# Patient Record
Sex: Female | Born: 1970 | Race: White | Hispanic: No | Marital: Married | Smoking: Current every day smoker
Health system: Southern US, Community
[De-identification: ages and names within clinical notes are randomized; demographics above are authoritative.]

## PROBLEM LIST (undated history)

## (undated) DIAGNOSIS — I10 Essential (primary) hypertension: Secondary | ICD-10-CM

## (undated) HISTORY — DX: Essential (primary) hypertension: I10

## (undated) HISTORY — PX: LEEP: SHX91

---

## 1997-09-03 ENCOUNTER — Encounter: Admission: RE | Admit: 1997-09-03 | Discharge: 1997-09-03 | Payer: Self-pay | Admitting: Family Medicine

## 1997-09-12 ENCOUNTER — Other Ambulatory Visit: Admission: RE | Admit: 1997-09-12 | Discharge: 1997-09-12 | Payer: Self-pay | Admitting: *Deleted

## 1997-09-12 ENCOUNTER — Encounter: Admission: RE | Admit: 1997-09-12 | Discharge: 1997-09-12 | Payer: Self-pay | Admitting: Family Medicine

## 1997-10-14 ENCOUNTER — Encounter: Admission: RE | Admit: 1997-10-14 | Discharge: 1997-10-14 | Payer: Self-pay | Admitting: Sports Medicine

## 1997-11-11 ENCOUNTER — Encounter: Admission: RE | Admit: 1997-11-11 | Discharge: 1997-11-11 | Payer: Self-pay | Admitting: Sports Medicine

## 1997-12-04 ENCOUNTER — Ambulatory Visit (HOSPITAL_COMMUNITY): Admission: RE | Admit: 1997-12-04 | Discharge: 1997-12-04 | Payer: Self-pay | Admitting: Sports Medicine

## 1997-12-15 ENCOUNTER — Encounter: Admission: RE | Admit: 1997-12-15 | Discharge: 1997-12-15 | Payer: Self-pay | Admitting: Family Medicine

## 1997-12-16 ENCOUNTER — Encounter: Admission: RE | Admit: 1997-12-16 | Discharge: 1997-12-16 | Payer: Self-pay | Admitting: Sports Medicine

## 1998-01-05 ENCOUNTER — Encounter: Admission: RE | Admit: 1998-01-05 | Discharge: 1998-01-05 | Payer: Self-pay | Admitting: Sports Medicine

## 1998-02-05 ENCOUNTER — Encounter: Admission: RE | Admit: 1998-02-05 | Discharge: 1998-02-05 | Payer: Self-pay | Admitting: Family Medicine

## 1998-02-19 ENCOUNTER — Encounter: Admission: RE | Admit: 1998-02-19 | Discharge: 1998-02-19 | Payer: Self-pay | Admitting: Family Medicine

## 1998-03-05 ENCOUNTER — Encounter: Admission: RE | Admit: 1998-03-05 | Discharge: 1998-03-05 | Payer: Self-pay | Admitting: Family Medicine

## 1998-03-10 ENCOUNTER — Ambulatory Visit (HOSPITAL_COMMUNITY): Admission: RE | Admit: 1998-03-10 | Discharge: 1998-03-10 | Payer: Self-pay | Admitting: *Deleted

## 1998-03-31 ENCOUNTER — Encounter: Admission: RE | Admit: 1998-03-31 | Discharge: 1998-03-31 | Payer: Self-pay | Admitting: Sports Medicine

## 1998-04-01 ENCOUNTER — Ambulatory Visit (HOSPITAL_COMMUNITY): Admission: RE | Admit: 1998-04-01 | Discharge: 1998-04-01 | Payer: Self-pay | Admitting: Obstetrics

## 1998-04-08 ENCOUNTER — Encounter: Admission: RE | Admit: 1998-04-08 | Discharge: 1998-04-08 | Payer: Self-pay | Admitting: Family Medicine

## 1998-04-15 ENCOUNTER — Encounter: Admission: RE | Admit: 1998-04-15 | Discharge: 1998-04-15 | Payer: Self-pay | Admitting: Family Medicine

## 1998-04-16 ENCOUNTER — Inpatient Hospital Stay (HOSPITAL_COMMUNITY): Admission: AD | Admit: 1998-04-16 | Discharge: 1998-04-19 | Payer: Self-pay | Admitting: Obstetrics & Gynecology

## 1998-06-12 ENCOUNTER — Encounter: Admission: RE | Admit: 1998-06-12 | Discharge: 1998-06-12 | Payer: Self-pay | Admitting: Family Medicine

## 1998-06-30 ENCOUNTER — Encounter: Admission: RE | Admit: 1998-06-30 | Discharge: 1998-06-30 | Payer: Self-pay | Admitting: Family Medicine

## 1999-08-17 ENCOUNTER — Encounter: Payer: Self-pay | Admitting: *Deleted

## 1999-08-17 ENCOUNTER — Inpatient Hospital Stay (HOSPITAL_COMMUNITY): Admission: EM | Admit: 1999-08-17 | Discharge: 1999-08-19 | Payer: Self-pay | Admitting: *Deleted

## 1999-09-29 ENCOUNTER — Encounter: Admission: RE | Admit: 1999-09-29 | Discharge: 1999-09-29 | Payer: Self-pay | Admitting: Family Medicine

## 1999-11-16 ENCOUNTER — Other Ambulatory Visit: Admission: RE | Admit: 1999-11-16 | Discharge: 1999-11-16 | Payer: Self-pay | Admitting: Family Medicine

## 2000-01-04 ENCOUNTER — Encounter: Admission: RE | Admit: 2000-01-04 | Discharge: 2000-01-04 | Payer: Self-pay | Admitting: Obstetrics & Gynecology

## 2000-01-28 ENCOUNTER — Encounter: Admission: RE | Admit: 2000-01-28 | Discharge: 2000-01-28 | Payer: Self-pay | Admitting: Obstetrics & Gynecology

## 2000-01-28 ENCOUNTER — Other Ambulatory Visit: Admission: RE | Admit: 2000-01-28 | Discharge: 2000-01-28 | Payer: Self-pay | Admitting: Obstetrics & Gynecology

## 2000-03-21 ENCOUNTER — Encounter: Admission: RE | Admit: 2000-03-21 | Discharge: 2000-03-21 | Payer: Self-pay | Admitting: Obstetrics & Gynecology

## 2011-04-25 ENCOUNTER — Ambulatory Visit: Payer: Self-pay | Admitting: Internal Medicine

## 2011-04-25 ENCOUNTER — Ambulatory Visit: Payer: Self-pay

## 2011-04-25 DIAGNOSIS — R079 Chest pain, unspecified: Secondary | ICD-10-CM

## 2011-04-25 DIAGNOSIS — R030 Elevated blood-pressure reading, without diagnosis of hypertension: Secondary | ICD-10-CM

## 2011-04-25 MED ORDER — MELOXICAM 15 MG PO TABS: 15.0000 mg | ORAL_TABLET | Freq: Every day | ORAL | Status: AC

## 2011-04-25 MED ORDER — HYDROCHLOROTHIAZIDE 12.5 MG PO TABS: 12.5000 mg | ORAL_TABLET | Freq: Every day | ORAL | Status: DC

## 2011-04-25 NOTE — Progress Notes (Signed)
  Subjective:    Patient ID: Carolyn Savage, female    DOB: [birth year], 41 y.o.   MRN: [id number]  HPIEpisodes of sudden onset at rest of a sharp stabbing anterior chest pain, so severe that it takes her breath, lasting for 15 minutes and resolving with a dull pain substernally that lasts for several hours or days., There is no associated shortness of breath , diaphoresis, palpitations or dizziness. She has never had heart problems in the past. Last night her pain was so severe that EMS was called and she was told that her blood pressure was 140/85 with a normal EKG except for right axis deviation. She has had a history of similar pain on and off since the age of 8 when she was told it was growing pains. 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/ this is presented no problems since then Pulmonary-no shortness of breath or dyspnea on exertion no chronic cough GI-no nausea, heartburn, dyspepsia, history of reflux, change in appetite  Social history: married with 5 kids between 36 and 28 Works at a [employer]  Family history: Both parents with hypertension      Objective:   Physical ExamIn no acute distress with stable vital signs except blood pressure 143/83 and repeated in room at 145/85 HEENT is clear No thyromegaly or lymphadenopathy The heart is regular without murmurs rubs or gallops/no clicks Lungs are clear The chest wall is tender to palpation at the right fourth costo sternal junction and it somewhat mimics her pain     [facility] reading (PRIMARY) by  Dr.Orlene Salmons:NAD      Assessment & Plan:  Problem #1 chest wall pain versus pain from undiagnosed GI  cause Trial of Mobic 15 daily for one month  Problem #2 elevated blood pressure with probable diagnosis of hypertension She wants to start treatment/hydrochlorothiazide 12.5 mg #90 Home blood pressures to be followed Recheck here in one to 3 months with those results  Problem #3 smoker Discussed cessation

## 2011-04-26 ENCOUNTER — Encounter: Payer: Self-pay | Admitting: Internal Medicine

## 2013-04-24 ENCOUNTER — Ambulatory Visit: Payer: Managed Care, Other (non HMO) | Admitting: Family Medicine

## 2013-04-24 VITALS — BP 130/80 | HR 72 | Temp 98.3°F | Ht 63.75 in | Wt 133.1 lb

## 2013-04-24 DIAGNOSIS — N939 Abnormal uterine and vaginal bleeding, unspecified: Secondary | ICD-10-CM

## 2013-04-24 DIAGNOSIS — N926 Irregular menstruation, unspecified: Secondary | ICD-10-CM

## 2013-04-24 LAB — POCT URINE PREGNANCY: Preg Test, Ur: NEGATIVE

## 2013-04-24 NOTE — Patient Instructions (Signed)
Thank you for coming in today  Your pregnancy is negative We will check some labs including thyroid and hormones If these are normal, but you have no periods for the next 2 months I would consider a pelvic ultrasound  Followup for persistent symptoms  Secondary Amenorrhea  Secondary amenorrhea is the stopping of menstrual flow for 3 6 months in a female who has previously had periods. There are many possible causes. Most of these causes are not serious. Usually, treating the underlying problem causing the loss of menses will return your periods to normal. CAUSES  Some common and uncommon causes of not menstruating include:  Malnutrition.  Low blood sugar (hypoglycemia).  Polycystic ovary disease.  Stress or fear.  Breastfeeding.  Hormone imbalance.  Ovarian failure.  Medicines.  Extreme obesity.  Cystic fibrosis.  Low body weight or drastic weight reduction from any cause.  Early menopause.  Removal of ovaries or uterus.  Contraceptives.  Illness.  Long-term (chronic) illnesses.  Cushing syndrome.  Thyroid problems.  Birth control pills, patches, or vaginal rings for birth control. RISK FACTORS You may be at greater risk of secondary amenorrhea if:  You have a family history of this condition.  You have an eating disorder.  You do athletic training. DIAGNOSIS  A diagnosis is made by your health care provider taking a medical history and doing a physical exam. This will include a pelvic exam to check for problems with your reproductive organs. Pregnancy must be ruled out. Often, numerous blood tests are done to measure different hormones in the body. Urine testing may be done. Specialized exams (ultrasound, CT scan, MRI, or hysteroscopy) may have to be done as well as measuring the body mass index (BMI). TREATMENT  Treatment depends on the cause of the amenorrhea. If an eating disorder is present, this can be treated with an adequate diet and therapy.  Chronic illnesses may improve with treatment of the illness. Amenorrhea may be corrected with medicines, lifestyle changes, or surgery. If the amenorrhea cannot be corrected, it is sometimes possible to create a false menstruation with medicines. HOME CARE INSTRUCTIONS  Maintain a healthy diet.  Manage weight problems.  Exercise regularly but not excessively.  Get adequate sleep.  Manage stress.  Be aware of changes in your menstrual cycle. Keep a record of when your periods occur. Note the date your period starts, how long it lasts, and any problems. SEEK MEDICAL CARE IF: Your symptoms do not get better with treatment. Document Released: 02/14/2006 Document Revised: 09/05/2012 Document Reviewed: 06/21/2012 [facility] Patient Information 2014 [place], [place].

## 2013-04-24 NOTE — Progress Notes (Signed)
   Subjective:    Patient ID: Carolyn Savage, female    DOB: [birth year], 43 y.o.   MRN: [id number]  HPI Patient presents with abnormal menses. Has not had menstrual cycle in past 2 months. Has taken 2 home pregnancy tests that were negative. Is having symptoms like she is getting ready to get period but she then does not have a period. Has gotten some cramping like menstrual cramps but no bleeding. Will also get the same headache that she usually had right before her headache and that came and went without menses. Has occasional unprotected sex. Last intercourse 2 weeks ago. No history of irregular menses. No hot flashes, vaginal dryness but has had some irritability for the last few months. No medications or supplements.   Review of Systems Has noticed that hair and skin more brittle and dry. No change in bowel habits. No palpitations. No change in cold or heat sensitivity. Daughter has thyroid problems and has to take synthroid. No weight loss or gain.    Objective:   Physical Exam  Constitutional: She is oriented to person, place, and time. She appears well-developed and well-nourished.  HENT:  Head: Normocephalic and atraumatic.  Mouth/Throat: Oropharynx is clear and moist. No oropharyngeal exudate.  Eyes: Conjunctivae are normal. Pupils are equal, round, and reactive to light. No scleral icterus.  Neck: Normal range of motion. Neck supple. No thyromegaly present.  Cardiovascular: Normal rate, regular rhythm, normal heart sounds and intact distal pulses.   Pulmonary/Chest: Effort normal and breath sounds normal.  Abdominal: Soft. She exhibits no distension and no mass. There is no tenderness. There is no rebound and no guarding.  Musculoskeletal: Normal range of motion.  Lymphadenopathy:    She has no cervical adenopathy.  Neurological: She is alert and oriented to person, place, and time.  Skin: Skin is warm and dry.  Psychiatric: She has a normal mood and affect. Her behavior is  normal.  No hirsutism No appreciable change in hair or skin No acne    Assessment & Plan:  #1. Irregular menses - Only 2 months so does not meet criteria yet for secondary amenorrhea - Will obtain UPT - negative. - FSH/LH, TSH, PRL pending - If symptoms persist and labs normal, would consider transvaginal ultrasound

## 2013-04-25 LAB — PROLACTIN: Prolactin: 6.8 ng/mL

## 2013-04-25 LAB — TSH: TSH: 0.721 u[IU]/mL (ref 0.350–4.500)

## 2013-04-25 LAB — FSH/LH
FSH: 8.2 m[IU]/mL
LH: 8.4 m[IU]/mL

## 2013-05-09 ENCOUNTER — Telehealth: Payer: Self-pay

## 2013-05-09 NOTE — Telephone Encounter (Signed)
Open in error

## 2013-05-09 NOTE — Telephone Encounter (Signed)
Opened in error

## 2013-05-09 NOTE — Telephone Encounter (Signed)
Message copied by Fidel LevyOE, RENEE R on Thu May 09, 2013  3:40 PM ------      Message from: Daine GipVOSS, ERIN L      Created: Tue May 07, 2013  2:10 PM       Please notify patient that labs are normal. Plan as we discussed at her appointment.            Thanks            Erin ------

## 2013-06-14 ENCOUNTER — Ambulatory Visit: Payer: Managed Care, Other (non HMO)

## 2013-06-14 ENCOUNTER — Ambulatory Visit: Payer: Managed Care, Other (non HMO) | Admitting: Emergency Medicine

## 2013-06-14 VITALS — BP 118/80 | HR 85 | Temp 98.6°F | Resp 16

## 2013-06-14 DIAGNOSIS — S93409A Sprain of unspecified ligament of unspecified ankle, initial encounter: Secondary | ICD-10-CM

## 2013-06-14 DIAGNOSIS — M25579 Pain in unspecified ankle and joints of unspecified foot: Secondary | ICD-10-CM

## 2013-06-14 MED ORDER — ACETAMINOPHEN-CODEINE #3 300-30 MG PO TABS: 1.0000 | ORAL_TABLET | ORAL | Status: DC | PRN

## 2013-06-14 NOTE — Progress Notes (Signed)
[facility] [address] [place] [address] [phone]  Date:  06/14/2013   Name:  Carolyn Savage   DOB:  Jul 24, 1970   MRN:  [id number]  PCP:  No primary provider on file.    Chief Complaint: Ankle Injury   History of Present Illness:  Carolyn Savage is a 43 y.o. very pleasant female patient who presents with the following:  Injured her right ankle while on stairs.  Says she had an inversion injury and felt and heard a "pop".  Has pain and unable to bear weight. Moderately swollen.  No improvement with over the counter medications or other home remedies. Denies other complaint or health concern today.   There are no active problems to display for this patient.   History reviewed. No pertinent past medical history.  History reviewed. No pertinent past surgical history.  History  Substance Use Topics  . Smoking status: Current Every Day Smoker    Types: Cigarettes  . Smokeless tobacco: Not on file  . Alcohol Use: Not on file    Family History  Problem Relation Age of Onset  . Hyperlipidemia Mother   . Hypertension Mother   . Cancer Father   . Diabetes Father   . Cancer Maternal Grandmother   . Diabetes Maternal Grandmother   . Stroke Maternal Grandfather     No Known Allergies  Medication list has been reviewed and updated.  No current outpatient prescriptions on file prior to visit.   No current facility-administered medications on file prior to visit.    Review of Systems:  As per HPI, otherwise negative.    Physical Examination: Filed Vitals:   06/14/13 1743  BP: 118/80  Pulse: 85  Temp: 98.6 F (37 C)  Resp: 16   There were no vitals filed for this visit. There is no weight on file to calculate BMI. Ideal Body Weight:     GEN: WDWN, NAD, Non-toxic, Alert & Oriented x 3 HEENT: Atraumatic, Normocephalic.  Ears and Nose: No external deformity. EXTR: No clubbing/cyanosis/edema NEURO: wheelchair bound  PSYCH:  Normally interactive. Conversant. Not depressed or anxious appearing.  Calm demeanor.   RIGHT ankle:  Guards, swollen and tender lateral ankle.  No deformity  Assessment and Plan: Sprain ankle Crutches  Boot Follow up in one week Tylenol #3 RICE  Signed,  Phillips Odor, MD   [facility] reading (PRIMARY) by  Dr. Dareen Piano. Negative .

## 2013-06-14 NOTE — Patient Instructions (Signed)

## 2013-07-12 ENCOUNTER — Ambulatory Visit (INDEPENDENT_AMBULATORY_CARE_PROVIDER_SITE_OTHER): Payer: Managed Care, Other (non HMO) | Admitting: Emergency Medicine

## 2013-07-12 ENCOUNTER — Ambulatory Visit (INDEPENDENT_AMBULATORY_CARE_PROVIDER_SITE_OTHER): Payer: Managed Care, Other (non HMO)

## 2013-07-12 VITALS — BP 104/68 | HR 89 | Temp 98.2°F | Resp 16 | Ht 65.0 in | Wt 139.0 lb

## 2013-07-12 DIAGNOSIS — S93409A Sprain of unspecified ligament of unspecified ankle, initial encounter: Secondary | ICD-10-CM

## 2013-07-12 DIAGNOSIS — Z5189 Encounter for other specified aftercare: Secondary | ICD-10-CM

## 2013-07-12 DIAGNOSIS — S93401D Sprain of unspecified ligament of right ankle, subsequent encounter: Secondary | ICD-10-CM

## 2013-07-12 DIAGNOSIS — M25571 Pain in right ankle and joints of right foot: Secondary | ICD-10-CM

## 2013-07-12 DIAGNOSIS — M25579 Pain in unspecified ankle and joints of unspecified foot: Secondary | ICD-10-CM

## 2013-07-12 NOTE — Patient Instructions (Signed)
Wear the lace up brace for the next week (can take off while sleeping).  In the next 2 weeks, i want you to wean yourself off the brace.    Do the exercises faithfully for the next two weeks.  Don't be afraid to put weight on the ankle  If you are worsening or not improving in the next 2 weeks, orthopedics will be our next step

## 2013-07-12 NOTE — Progress Notes (Signed)
Subjective:    Patient ID: Carolyn Savage, female    DOB: 1970/06/28, 43 y.o.   MRN: [id number]  HPI   Carolyn Savage is a very pleasant 43 yr old female here for follow up on a RIGHT ankle sprain.  She was initially seen here 1 month ago.  She was instructed to follow up in 1 week but did not do so due to concern for the cost.  She has been wearing and tall CAM walker and ambulating with crutches since that time.  She reports she has had only slight improvement.  She continues to be in pain.  She describes burning pain near both malleoli.  She has pain with weight bearing.  She feels that the ankle is swollen and states she can't fit into her shoes.  She has not taken anything for pain.  She denies numbness or tingling in the foot.  No pain in knee or hip.  She reports she did fracture her RIGHT ankle in 7th grade but has had no problems since that time.   Review of Systems  Constitutional: Negative for fever and chills.  Respiratory: Negative.   Cardiovascular: Negative.   Musculoskeletal: Positive for arthralgias, gait problem, joint swelling and myalgias.  Skin: Negative.   Neurological: Negative for weakness and numbness.       Objective:   Physical Exam  Vitals reviewed. Constitutional: She is oriented to person, place, and time. She appears well-developed and well-nourished. No distress.  HENT:  Head: Normocephalic and atraumatic.  Eyes: Conjunctivae are normal. No scleral icterus.  Cardiovascular: Intact distal pulses.   Pulmonary/Chest: Effort normal and breath sounds normal.  Musculoskeletal:       Right ankle: She exhibits no swelling, no ecchymosis, no deformity and normal pulse. Tenderness. Lateral malleolus tenderness found. No head of 5th metatarsal and no proximal fibula tenderness found. Achilles tendon normal.  Generalized tenderness about the RIGHT ankle most prominently at the lateral malleolus; active range is limited in all plains but suspect that this may be due  to aprehension  Neurological: She is alert and oriented to person, place, and time.  Skin: Skin is warm and dry.  Psychiatric: She has a normal mood and affect. Her behavior is normal.      [facility] reading (PRIMARY) by  Dr. Cleta Albertsaub - negative      Assessment & Plan:  Ankle sprain, right, subsequent encounter - Plan: DG Ankle Complete Right  Pain in joint, ankle and foot, right   Carolyn Savage is a pleasant 43 yr old female here for follow up on an ankle sprain.  She was initially seen here 1 month ago, did not follow up at 1 week as directed, has been wearing tall CAM and ambulating with crutches since that time.  She continues to have pain and difficulty bearing weight.  Xrays are normal again today.  Suspect that much of her problem is that she has been immobilized for too long in the CAM walker.  Changed to sweedo lace up brace today.  I want her to wean out of this over the next 2 weeks.  Printed exercises for her to do over these next 2 weeks.  If no improvement or if worsening, will need to see ortho.  Offered both ortho and PT to pt today but she declined citing cost.  She is aware that without improvement, the next step will be ortho - she does not necessarily need an OV for this, we can place a referral  over the phone.  Pt to call or RTC if worsening or not improving  Carolyn Savage MHS, PA-C [facility] 6/26/20157:13 PM

## 2015-11-14 ENCOUNTER — Ambulatory Visit (INDEPENDENT_AMBULATORY_CARE_PROVIDER_SITE_OTHER): Payer: Managed Care, Other (non HMO)

## 2015-11-14 ENCOUNTER — Ambulatory Visit (INDEPENDENT_AMBULATORY_CARE_PROVIDER_SITE_OTHER): Payer: Managed Care, Other (non HMO) | Admitting: Family Medicine

## 2015-11-14 VITALS — BP 120/76 | HR 74 | Temp 98.2°F | Resp 16 | Ht 65.0 in | Wt 139.0 lb

## 2015-11-14 DIAGNOSIS — M545 Low back pain, unspecified: Secondary | ICD-10-CM

## 2015-11-14 LAB — POCT URINE PREGNANCY: Preg Test, Ur: NEGATIVE

## 2015-11-14 MED ORDER — MELOXICAM 7.5 MG PO TABS: 7.5000 mg | ORAL_TABLET | Freq: Every day | ORAL | 3 refills | Status: DC

## 2015-11-14 NOTE — Progress Notes (Signed)
Chief Complaint  Patient presents with  . Back Pain    Lower back pain   . Arm Pain    right arm pain with lifting     HPI Pt reports that she has pain of the right arm from the elbow to the the wrist She reports that her symptoms are more noticeable at night She reports that when she has to life something that is greater than 5 pounds She reports that she gets constant low back pain that is concentrated in the very center.  She reports that this has been present for 3 weeks.  She has tried tylenol and ibuprofen without any improvement She was using icy hot and it took the edge off the pain.  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  No fevers or chills No difficulty urinating She reports that the pain is worse with twisting She gets pain with standing and has to slowly rise into a standing position With sitting the pain is tolerable  When the pain is at its worse 10/10. Currently she would rate her pain 6/10 because she is sitting.  She came in today because she googled her symptoms and it said she had bone cancer.   No past medical history on file.  Current Outpatient Prescriptions  Medication Sig Dispense Refill  . meloxicam (MOBIC) 7.5 MG tablet Take 1 tablet (7.5 mg total) by mouth daily. 30 tablet 3   No current facility-administered medications for this visit.     Allergies: No Known Allergies  No past surgical history on file.  Social History   Social History  . Marital status: Married    Spouse name: N/A  . Number of children: N/A  . Years of education: N/A   Social History Main Topics  . Smoking status: Current Every Day Smoker    Types: Cigarettes  . Smokeless tobacco: None  . Alcohol use None  . Drug use: Unknown  . Sexual activity: Not Asked   Other Topics Concern  . None   Social History Narrative  .  None    ROS See hpi  Objective: Vitals:   11/14/15 1501  BP: 120/76  Pulse: 74  Resp: 16  Temp: 98.2 F (36.8 C)  TempSrc: Oral  SpO2: 98%  Weight: 139 lb (63 kg)  Height: 5\' 5"  (1.651 m)    Physical Exam  Constitutional: She is oriented to person, place, and time. She appears well-developed and well-nourished.  HENT:  Head: Normocephalic and atraumatic.  Eyes: Conjunctivae and EOM are normal.  Neck: Normal range of motion. Neck supple.  Cardiovascular: Normal rate, regular rhythm and normal heart sounds.   No murmur heard. Pulmonary/Chest: Effort normal and breath sounds normal. No respiratory distress. She has no wheezes.  Musculoskeletal:       Cervical back: Normal. She exhibits normal range of motion and no tenderness.       Thoracic back: Normal. She exhibits normal range of motion and no tenderness.       Lumbar back: She exhibits tenderness. She exhibits no swelling, no edema and no deformity.  Neurological: She is alert and oriented to person, place, and time. She has normal reflexes.   CLINICAL DATA:  Low back pain  EXAM: LUMBAR SPINE - 2-3 VIEW  COMPARISON:  None.  FINDINGS: There is anatomic alignment. There is no  vertebral compression deformity. There is moderate narrowing of the L5-S1 disc. Minimal facet arthropathy at L4-5 and L5-S1.  IMPRESSION: No acute bony pathology.  Chronic changes.   Electronically Signed   By: Jolaine ClickArthur  Hoss M.D.   On: 11/14/2015 15:50   Assessment and Plan Aram BeechamCynthia was seen today for back pain and arm pain.  Diagnoses and all orders for this visit:  Acute midline low back pain without sciatica- mild DDD of lumbar spine, moderate narrowing of L5-S1 disc Advised to follow up with PT for core strengthening and exercises to reduce back pain Follow up if pain is unimproved  -     DG Lumbar Spine 2-3 Views -     POCT urine pregnancy -     Ambulatory referral to Physical Therapy -     meloxicam (MOBIC) 7.5 MG  tablet; Take 1 tablet (7.5 mg total) by mouth daily.  A total of 30 minutes were spent face-to-face with the patient during this encounter and over half of that time was spent on counseling and coordination of care.   Doristine BosworthZoe A Izzabella Besse MD   Jae Bruck Camelia PhenesA Dalilah Curlin

## 2015-11-14 NOTE — Patient Instructions (Signed)
     IF you received an x-ray today, you will receive an invoice from [facility]. Please contact [facility] at [phone] with questions or concerns regarding your invoice.   IF you received labwork today, you will receive an invoice from [facility]. Please contact [facility] at [phone] with questions or concerns regarding your invoice.   Our billing staff will not be able to assist you with questions regarding bills from these companies.  You will be contacted with the lab results as soon as they are available. The fastest way to get your results is to activate your My Chart account. Instructions are located on the last page of this paperwork. If you have not heard from us regarding the results in 2 weeks, please contact this office.      

## 2016-03-04 ENCOUNTER — Ambulatory Visit: Payer: Managed Care, Other (non HMO)

## 2016-03-05 ENCOUNTER — Ambulatory Visit (INDEPENDENT_AMBULATORY_CARE_PROVIDER_SITE_OTHER): Payer: Managed Care, Other (non HMO) | Admitting: Physician Assistant

## 2016-03-05 VITALS — BP 108/70 | HR 70 | Temp 98.3°F | Resp 16 | Ht 65.0 in | Wt 138.8 lb

## 2016-03-05 DIAGNOSIS — Z1329 Encounter for screening for other suspected endocrine disorder: Secondary | ICD-10-CM | POA: Diagnosis not present

## 2016-03-05 DIAGNOSIS — Z13228 Encounter for screening for other metabolic disorders: Secondary | ICD-10-CM

## 2016-03-05 DIAGNOSIS — Z1231 Encounter for screening mammogram for malignant neoplasm of breast: Secondary | ICD-10-CM

## 2016-03-05 DIAGNOSIS — Z130 Encounter for screening for diseases of the blood and blood-forming organs and certain disorders involving the immune mechanism: Secondary | ICD-10-CM | POA: Diagnosis not present

## 2016-03-05 DIAGNOSIS — E7800 Pure hypercholesterolemia, unspecified: Secondary | ICD-10-CM | POA: Diagnosis not present

## 2016-03-05 DIAGNOSIS — Z01419 Encounter for gynecological examination (general) (routine) without abnormal findings: Secondary | ICD-10-CM

## 2016-03-05 DIAGNOSIS — Z114 Encounter for screening for human immunodeficiency virus [HIV]: Secondary | ICD-10-CM

## 2016-03-05 DIAGNOSIS — Z0000 Encounter for general adult medical examination without abnormal findings: Secondary | ICD-10-CM | POA: Diagnosis not present

## 2016-03-05 NOTE — Progress Notes (Signed)
Carolyn Savage  MRN: [id number] DOB: 04-08-1970  Subjective:  Pt presents to clinic for a CPE.  Last dental exam: never  Last vision exam: long time Last pap smear: almost 17 years ago Last mammogram: never  Vaccinations      Tetanus last 10 years  Exercise: no Diet: mostly eats out, coffee, water, 1 mountain dew Sleep: no problems  There are no active problems to display for this patient.   No current outpatient prescriptions on file prior to visit.   No current facility-administered medications on file prior to visit.     No Known Allergies  Social History   Social History  . Marital status: Married    Spouse name: N/A  . Number of children: N/A  . Years of education: N/A   Social History Main Topics  . Smoking status: Current Every Day Smoker    Packs/day: 1.00    Years: 21.00    Types: Cigarettes  . Smokeless tobacco: Never Used  . Alcohol use No     Comment: social very rare - a few times a year  . Drug use: No  . Sexual activity: Yes    Partners: Male   Other Topics Concern  . None   Social History Narrative   Marital status: married   Children: 5    Lives with: husband and 2 children   Employment: Press photographer       Exercise:  Not regularly   Sleep: most part   Seatbelt: 100%   Guns in home: yes       Secured: yes          History reviewed. No pertinent surgical history.  Family History  Problem Relation Age of Onset  . Hyperlipidemia Mother   . Hypertension Mother   . Lung cancer Mother     smoker  . Diabetes Father   . Lung cancer Father     smoker  . Breast cancer Sister     age 54  . Diabetes Maternal Grandmother   . Breast cancer Maternal Grandmother   . Stroke Maternal Grandfather     Review of Systems  Constitutional: Negative.   HENT: Negative.   Eyes: Positive for visual disturbance (random).  Respiratory: Positive for cough (intermittent). Negative for shortness of breath and wheezing.   Cardiovascular:  Negative.   Gastrointestinal: Negative.   Endocrine: Negative.   Genitourinary: Positive for urgency (stress incontinence).  Musculoskeletal: Negative.   Skin: Negative.   Allergic/Immunologic: Negative.   Neurological: Positive for headaches (intermittent - self resolution or treated with tylenol o rmotrin than resolves).  Hematological: Negative.   Psychiatric/Behavioral: Negative.     Objective:  BP 108/70   Pulse 70   Temp 98.3 F (36.8 C) (Oral)   Resp 16   Ht 5' 5"  (1.651 m)   Wt 138 lb 12.8 oz (63 kg)   LMP 11/18/2014 Comment: Hasn't had period in over a year   SpO2 95%   BMI 23.10 kg/m   Physical Exam  Constitutional: She is oriented to person, place, and time and well-developed, well-nourished, and in no distress.  HENT:  Head: Normocephalic and atraumatic.  Right Ear: Hearing, tympanic membrane, external ear and ear canal normal.  Left Ear: Hearing, tympanic membrane, external ear and ear canal normal.  Nose: Nose normal.  Mouth/Throat: Uvula is midline, oropharynx is clear and moist and mucous membranes are normal.  Eyes: Conjunctivae and EOM are normal. Pupils are equal, round, and reactive to light.  Neck: Trachea normal and normal range of motion. Neck supple. No thyroid mass and no thyromegaly present.  Cardiovascular: Normal rate, regular rhythm and normal heart sounds.   No murmur heard. Pulmonary/Chest: Effort normal and breath sounds normal. She has no wheezes. Right breast exhibits no inverted nipple, no mass, no nipple discharge, no skin change and no tenderness. Left breast exhibits no inverted nipple, no mass, no nipple discharge, no skin change and no tenderness. Breasts are symmetrical.  Abdominal: Soft. Bowel sounds are normal. There is no tenderness.  Genitourinary: Vagina normal, uterus normal, cervix normal, right adnexa normal, left adnexa normal and vulva normal.  Musculoskeletal: Normal range of motion.  Lymphadenopathy:    She has no cervical  adenopathy.  Neurological: She is alert and oriented to person, place, and time. She has normal motor skills, normal sensation, normal strength and normal reflexes. Gait normal.  Skin: Skin is warm and dry.  Psychiatric: Mood, memory, affect and judgment normal.    Visual Acuity Screening   Right eye Left eye Both eyes  Without correction: 20/40-1 20/40 20/25  With correction:       Assessment and Plan :  Annual physical exam - anticipatory guidance  Screening for deficiency anemia - Plan: CBC with Differential/Platelet  Screening for metabolic disorder - Plan: CMP14+EGFR  Elevated cholesterol - Plan: Lipid panel - h/o of high in the past but she did not want to take medications at that time and she does not think that she wants to start medication for that now - we discussed fish oil and increase in omega 3 foods as well as red yeast rice - will counsel patient more once labs return  Screening for thyroid disorder - Plan: TSH  Screening mammogram, encounter for - Plan: MM Digital Screening - pt has never had before and her sister has h/o non-genetic breast cancer - she needs to start screenings  Encounter for gynecological examination without abnormal finding - Plan: Pap IG and HPV (high risk) DNA detection - check labs - pt declined STD testing  Screening for HIV (human immunodeficiency virus) - Plan: HIV antibody  Windell Hummingbird PA-C  [facility] [place] 03/05/2016 4:24 PM

## 2016-03-05 NOTE — Patient Instructions (Addendum)
Health Maintenance, Female Introduction Adopting a healthy lifestyle and getting preventive care can go a long way to promote health and wellness. Talk with your health care provider about what schedule of regular examinations is right for you. This is a good chance for you to check in with your provider about disease prevention and staying healthy. In between checkups, there are plenty of things you can do on your own. Experts have done a lot of research about which lifestyle changes and preventive measures are most likely to keep you healthy. Ask your health care provider for more information. Weight and diet Eat a healthy diet  Be sure to include plenty of vegetables, fruits, low-fat dairy products, and lean protein.  Do not eat a lot of foods high in solid fats, added sugars, or salt.  Get regular exercise. This is one of the most important things you can do for your health.  Most adults should exercise for at least 150 minutes each week. The exercise should increase your heart rate and make you sweat (moderate-intensity exercise).  Most adults should also do strengthening exercises at least twice a week. This is in addition to the moderate-intensity exercise. Maintain a healthy weight  Body mass index (BMI) is a measurement that can be used to identify possible weight problems. It estimates body fat based on height and weight. Your health care provider can help determine your BMI and help you achieve or maintain a healthy weight.  For females 4 years of age and older:  A BMI below 18.5 is considered underweight.  A BMI of 18.5 to 24.9 is normal.  A BMI of 25 to 29.9 is considered overweight.  A BMI of 30 and above is considered obese. Watch levels of cholesterol and blood lipids  You should start having your blood tested for lipids and cholesterol at 45 years of age, then have this test every 5 years.  You may need to have your cholesterol levels checked more often  if:  Your lipid or cholesterol levels are high.  You are older than 45 years of age.  You are at high risk for heart disease. Cancer screening Lung Cancer  Lung cancer screening is recommended for adults 12-31 years old who are at high risk for lung cancer because of a history of smoking.  A yearly low-dose CT scan of the lungs is recommended for people who:  Currently smoke.  Have quit within the past 15 years.  Have at least a 30-pack-year history of smoking. A pack year is smoking an average of one pack of cigarettes a day for 1 year.  Yearly screening should continue until it has been 15 years since you quit.  Yearly screening should stop if you develop a health problem that would prevent you from having lung cancer treatment. Breast Cancer  Practice breast self-awareness. This means understanding how your breasts normally appear and feel.  It also means doing regular breast self-exams. Let your health care provider know about any changes, no matter how small.  If you are in your 20s or 30s, you should have a clinical breast exam (CBE) by a health care provider every 1-3 years as part of a regular health exam.  If you are 74 or older, have a CBE every year. Also consider having a breast X-ray (mammogram) every year.  If you have a family history of breast cancer, talk to your health care provider about genetic screening.  If you are at high risk for breast cancer,  talk to your health care provider about having an MRI and a mammogram every year.  Breast cancer gene (BRCA) assessment is recommended for women who have family members with BRCA-related cancers. BRCA-related cancers include:  Breast.  Ovarian.  Tubal.  Peritoneal cancers.  Results of the assessment will determine the need for genetic counseling and BRCA1 and BRCA2 testing. Cervical Cancer  Your health care provider may recommend that you be screened regularly for cancer of the pelvic organs (ovaries,  uterus, and vagina). This screening involves a pelvic examination, including checking for microscopic changes to the surface of your cervix (Pap test). You may be encouraged to have this screening done every 3 years, beginning at age 59.  For women ages 9-65, health care providers may recommend pelvic exams and Pap testing every 3 years, or they may recommend the Pap and pelvic exam, combined with testing for human papilloma virus (HPV), every 5 years. Some types of HPV increase your risk of cervical cancer. Testing for HPV may also be done on women of any age with unclear Pap test results.  Other health care providers may not recommend any screening for nonpregnant women who are considered low risk for pelvic cancer and who do not have symptoms. Ask your health care provider if a screening pelvic exam is right for you.  If you have had past treatment for cervical cancer or a condition that could lead to cancer, you need Pap tests and screening for cancer for at least 20 years after your treatment. If Pap tests have been discontinued, your risk factors (such as having a new sexual partner) need to be reassessed to determine if screening should resume. Some women have medical problems that increase the chance of getting cervical cancer. In these cases, your health care provider may recommend more frequent screening and Pap tests. Colorectal Cancer  This type of cancer can be detected and often prevented.  Routine colorectal cancer screening usually begins at 45 years of age and continues through 45 years of age.  Your health care provider may recommend screening at an earlier age if you have risk factors for colon cancer.  Your health care provider may also recommend using home test kits to check for hidden blood in the stool.  A small camera at the end of a tube can be used to examine your colon directly (sigmoidoscopy or colonoscopy). This is done to check for the earliest forms of colorectal  cancer.  Routine screening usually begins at age 31.  Direct examination of the colon should be repeated every 5-10 years through 45 years of age. However, you may need to be screened more often if early forms of precancerous polyps or small growths are found. Skin Cancer  Check your skin from head to toe regularly.  Tell your health care provider about any new moles or changes in moles, especially if there is a change in a mole's shape or color.  Also tell your health care provider if you have a mole that is larger than the size of a pencil eraser.  Always use sunscreen. Apply sunscreen liberally and repeatedly throughout the day.  Protect yourself by wearing long sleeves, pants, a wide-brimmed hat, and sunglasses whenever you are outside. Heart disease, diabetes, and high blood pressure  High blood pressure causes heart disease and increases the risk of stroke. High blood pressure is more likely to develop in:  People who have blood pressure in the high end of the normal range (130-139/85-89 mm Hg).  People who are overweight or obese.  People who are African American.  If you are 18-39 years of age, have your blood pressure checked every 3-5 years. If you are 40 years of age or older, have your blood pressure checked every year. You should have your blood pressure measured twice-once when you are at a hospital or clinic, and once when you are not at a hospital or clinic. Record the average of the two measurements. To check your blood pressure when you are not at a hospital or clinic, you can use:  An automated blood pressure machine at a pharmacy.  A home blood pressure monitor.  If you are between 55 years and 79 years old, ask your health care provider if you should take aspirin to prevent strokes.  Have regular diabetes screenings. This involves taking a blood sample to check your fasting blood sugar level.  If you are at a normal weight and have a low risk for diabetes,  have this test once every three years after 45 years of age.  If you are overweight and have a high risk for diabetes, consider being tested at a younger age or more often. Preventing infection Hepatitis B  If you have a higher risk for hepatitis B, you should be screened for this virus. You are considered at high risk for hepatitis B if:  You were born in a country where hepatitis B is common. Ask your health care provider which countries are considered high risk.  Your parents were born in a high-risk country, and you have not been immunized against hepatitis B (hepatitis B vaccine).  You have HIV or AIDS.  You use needles to inject street drugs.  You live with someone who has hepatitis B.  You have had sex with someone who has hepatitis B.  You get hemodialysis treatment.  You take certain medicines for conditions, including cancer, organ transplantation, and autoimmune conditions. Hepatitis C  Blood testing is recommended for:  Everyone born from 1945 through 1965.  Anyone with known risk factors for hepatitis C. Sexually transmitted infections (STIs)  You should be screened for sexually transmitted infections (STIs) including gonorrhea and chlamydia if:  You are sexually active and are younger than 45 years of age.  You are older than 45 years of age and your health care provider tells you that you are at risk for this type of infection.  Your sexual activity has changed since you were last screened and you are at an increased risk for chlamydia or gonorrhea. Ask your health care provider if you are at risk.  If you do not have HIV, but are at risk, it may be recommended that you take a prescription medicine daily to prevent HIV infection. This is called pre-exposure prophylaxis (PrEP). You are considered at risk if:  You are sexually active and do not regularly use condoms or know the HIV status of your partner(s).  You take drugs by injection.  You are sexually  active with a partner who has HIV. Talk with your health care provider about whether you are at high risk of being infected with HIV. If you choose to begin PrEP, you should first be tested for HIV. You should then be tested every 3 months for as long as you are taking PrEP. Pregnancy  If you are premenopausal and you may become pregnant, ask your health care provider about preconception counseling.  If you may become pregnant, take 400 to 800 micrograms (mcg) of folic acid   every day.  If you want to prevent pregnancy, talk to your health care provider about birth control (contraception). Osteoporosis and menopause  Osteoporosis is a disease in which the bones lose minerals and strength with aging. This can result in serious bone fractures. Your risk for osteoporosis can be identified using a bone density scan.  If you are 48 years of age or older, or if you are at risk for osteoporosis and fractures, ask your health care provider if you should be screened.  Ask your health care provider whether you should take a calcium or vitamin D supplement to lower your risk for osteoporosis.  Menopause may have certain physical symptoms and risks.  Hormone replacement therapy may reduce some of these symptoms and risks. Talk to your health care provider about whether hormone replacement therapy is right for you. Follow these instructions at home:  Schedule regular health, dental, and eye exams.  Stay current with your immunizations.  Do not use any tobacco products including cigarettes, chewing tobacco, or electronic cigarettes.  If you are pregnant, do not drink alcohol.  If you are breastfeeding, limit how much and how often you drink alcohol.  Limit alcohol intake to no more than 1 drink per day for nonpregnant women. One drink equals 12 ounces of beer, 5 ounces of wine, or 1 ounces of hard liquor.  Do not use street drugs.  Do not share needles.  Ask your health care provider for  help if you need support or information about quitting drugs.  Tell your health care provider if you often feel depressed.  Tell your health care provider if you have ever been abused or do not feel safe at home. This information is not intended to replace advice given to you by your health care provider. Make sure you discuss any questions you have with your health care provider. Document Released: 07/19/2010 Document Revised: 06/11/2015 Document Reviewed: 10/07/2014  2017 Elsevier    IF you received an x-ray today, you will receive an invoice from [facility]. Please contact [facility] at [phone] with questions or concerns regarding your invoice.   IF you received labwork today, you will receive an invoice from [facility]. Please contact [facility] at [phone] with questions or concerns regarding your invoice.   Our billing staff will not be able to assist you with questions regarding bills from these companies.  You will be contacted with the lab results as soon as they are available. The fastest way to get your results is to activate your My Chart account. Instructions are located on the last page of this paperwork. If you have not heard from [place] regarding the results in 2 weeks, please contact this office.

## 2016-03-06 LAB — CBC WITH DIFFERENTIAL/PLATELET
Basophils Absolute: 0.1 10*3/uL (ref 0.0–0.2)
Basos: 1 %
EOS (ABSOLUTE): 0.3 10*3/uL (ref 0.0–0.4)
EOS: 2 %
HEMATOCRIT: 43.8 % (ref 34.0–46.6)
Hemoglobin: 15.5 g/dL (ref 11.1–15.9)
IMMATURE GRANULOCYTES: 0 %
Immature Grans (Abs): 0 10*3/uL (ref 0.0–0.1)
Lymphocytes Absolute: 3.1 10*3/uL (ref 0.7–3.1)
Lymphs: 30 %
MCH: 31.3 pg (ref 26.6–33.0)
MCHC: 35.4 g/dL (ref 31.5–35.7)
MCV: 88 fL (ref 79–97)
MONOS ABS: 0.6 10*3/uL (ref 0.1–0.9)
Monocytes: 6 %
Neutrophils Absolute: 6.2 10*3/uL (ref 1.4–7.0)
Neutrophils: 61 %
PLATELETS: 256 10*3/uL (ref 150–379)
RBC: 4.96 x10E6/uL (ref 3.77–5.28)
RDW: 13.4 % (ref 12.3–15.4)
WBC: 10.2 10*3/uL (ref 3.4–10.8)

## 2016-03-06 LAB — TSH: TSH: 0.415 u[IU]/mL — ABNORMAL LOW (ref 0.450–4.500)

## 2016-03-06 LAB — CMP14+EGFR
ALT: 13 IU/L (ref 0–32)
AST: 19 IU/L (ref 0–40)
Albumin/Globulin Ratio: 1.7 (ref 1.2–2.2)
Albumin: 4.4 g/dL (ref 3.5–5.5)
Alkaline Phosphatase: 97 IU/L (ref 39–117)
BUN/Creatinine Ratio: 12 (ref 9–23)
BUN: 11 mg/dL (ref 6–24)
Bilirubin Total: 0.3 mg/dL (ref 0.0–1.2)
CALCIUM: 9.8 mg/dL (ref 8.7–10.2)
CO2: 26 mmol/L (ref 18–29)
CREATININE: 0.92 mg/dL (ref 0.57–1.00)
Chloride: 102 mmol/L (ref 96–106)
GFR calc Af Amer: 88 mL/min/{1.73_m2} (ref >59)
GFR, EST NON AFRICAN AMERICAN: 76 mL/min/{1.73_m2} (ref >59)
GLUCOSE: 85 mg/dL (ref 65–99)
Globulin, Total: 2.6 g/dL (ref 1.5–4.5)
Potassium: 4.8 mmol/L (ref 3.5–5.2)
Sodium: 143 mmol/L (ref 134–144)
Total Protein: 7 g/dL (ref 6.0–8.5)

## 2016-03-06 LAB — LIPID PANEL
CHOL/HDL RATIO: 5.8 ratio — AB (ref 0.0–4.4)
Cholesterol, Total: 253 mg/dL — ABNORMAL HIGH (ref 100–199)
HDL: 44 mg/dL (ref >39)
LDL CALC: 175 mg/dL — AB (ref 0–99)
TRIGLYCERIDES: 169 mg/dL — AB (ref 0–149)
VLDL Cholesterol Cal: 34 mg/dL (ref 5–40)

## 2016-03-06 LAB — HIV ANTIBODY (ROUTINE TESTING W REFLEX): HIV Screen 4th Generation wRfx: NONREACTIVE

## 2016-03-08 NOTE — Addendum Note (Signed)
Addended by: Baldwin CrownJOHNSON, SHAQUETTA D on: 03/08/2016 04:37 PM   Modules accepted: Orders

## 2016-03-09 LAB — PAP IG AND HPV HIGH-RISK
HPV, high-risk: NEGATIVE
PAP SMEAR COMMENT: 0

## 2016-03-11 LAB — T4, FREE: FREE T4: 1.14 ng/dL (ref 0.82–1.77)

## 2016-03-11 LAB — SPECIMEN STATUS REPORT

## 2016-06-20 ENCOUNTER — Encounter: Payer: Self-pay | Admitting: Physician Assistant

## 2016-06-20 ENCOUNTER — Ambulatory Visit (INDEPENDENT_AMBULATORY_CARE_PROVIDER_SITE_OTHER): Payer: Managed Care, Other (non HMO) | Admitting: Physician Assistant

## 2016-06-20 VITALS — BP 144/73 | HR 64 | Temp 98.3°F | Resp 18 | Ht 65.0 in | Wt 133.0 lb

## 2016-06-20 DIAGNOSIS — N950 Postmenopausal bleeding: Secondary | ICD-10-CM

## 2016-06-20 NOTE — Patient Instructions (Signed)
     IF you received an x-ray today, you will receive an invoice from [facility]. Please contact [facility] at [phone] with questions or concerns regarding your invoice.   IF you received labwork today, you will receive an invoice from [facility]. Please contact [facility] at [phone] with questions or concerns regarding your invoice.   Our billing staff will not be able to assist you with questions regarding bills from these companies.  You will be contacted with the lab results as soon as they are available. The fastest way to get your results is to activate your My Chart account. Instructions are located on the last page of this paperwork. If you have not heard from us regarding the results in 2 weeks, please contact this office.     

## 2016-06-20 NOTE — Progress Notes (Signed)
   Carolyn SaverCynthia F Savage  MRN: [id number] DOB: Jun 04, 1970  PCP: Patient, No Pcp Per  Chief Complaint  Patient presents with  . Vaginal Bleeding    spots mostly after sex but is spotting now and hasnt been active in a couple of days and per pt has seen pink tissue discharge   . Abdominal Cramping    Subjective:  Pt presents to clinic for vaginal bleeding over the last month along with suprapubic pain - she thought she noticed it along with sex but her last sex was 3 days ago and she is still having some pinkish discharge.  Some pain during sex which is different than normal.  No change in sexual partner.  No change in roughness or activity, no problems with vaginal dryness.  She has some pain with penetration deep that is worse with position where he is able to penetrate deeper.  She is not worried about STDs.  Fells like the other day that maybe some tissue came out while on the toilet.  Last menes 10/2014 no bleeding since then.  Review of Systems  Genitourinary: Positive for vaginal discharge (bleeding).    There are no active problems to display for this patient.   No current outpatient prescriptions on file prior to visit.   No current facility-administered medications on file prior to visit.     No Known Allergies  Pt patients past, family and social history were reviewed and updated.   Objective:  BP (!) 144/73   Pulse 64   Temp 98.3 F (36.8 C) (Oral)   Resp 18   Ht 5\' 5"  (1.651 m)   Wt 133 lb (60.3 kg)   LMP 11/18/2014 Comment: Hasn't had period in over a year   SpO2 97%   BMI 22.13 kg/m   Physical Exam  Constitutional: She is oriented to person, place, and time and well-developed, well-nourished, and in no distress.  HENT:  Head: Normocephalic and atraumatic.  Right Ear: Hearing and external ear normal.  Left Ear: Hearing and external ear normal.  Eyes: Conjunctivae are normal.  Neck: Normal range of motion.  Pulmonary/Chest: Effort normal.  Genitourinary:  Uterus normal, right adnexa normal and vulva normal. Thin  red and vaginal discharge found.  Genitourinary Comments: Blood out of cervical os, no vaginal canal lesions  Neurological: She is alert and oriented to person, place, and time. Gait normal.  Skin: Skin is warm and dry.  Psychiatric: Mood, memory, affect and judgment normal.  Vitals reviewed.   Assessment and Plan :  Postmenopausal bleeding - Plan: GC/Chlamydia Probe Amp, Ambulatory referral to Gynecology check for chlamydia (low likelihood) and send for referral to gyn as she has been great than a year without menses  Benny LennertSarah Jaequan Propes PA-C  [facility] 06/20/2016 3:27 PM

## 2016-06-22 LAB — GC/CHLAMYDIA PROBE AMP
CHLAMYDIA, DNA PROBE: NEGATIVE
NEISSERIA GONORRHOEAE BY PCR: NEGATIVE

## 2016-12-05 ENCOUNTER — Ambulatory Visit: Payer: Managed Care, Other (non HMO) | Admitting: Physician Assistant

## 2016-12-05 ENCOUNTER — Other Ambulatory Visit: Payer: Self-pay

## 2016-12-05 ENCOUNTER — Encounter: Payer: Self-pay | Admitting: Physician Assistant

## 2016-12-05 VITALS — BP 126/82 | HR 82 | Temp 98.7°F | Resp 16 | Ht 65.0 in | Wt 132.0 lb

## 2016-12-05 DIAGNOSIS — M62830 Muscle spasm of back: Secondary | ICD-10-CM

## 2016-12-05 MED ORDER — CYCLOBENZAPRINE HCL 10 MG PO TABS: 5.0000 mg | ORAL_TABLET | Freq: Three times a day (TID) | ORAL | 0 refills | Status: DC | PRN

## 2016-12-05 MED ORDER — IBUPROFEN 600 MG PO TABS: 600.0000 mg | ORAL_TABLET | Freq: Three times a day (TID) | ORAL | 0 refills | Status: DC | PRN

## 2016-12-05 NOTE — Patient Instructions (Addendum)
Please take ibuprofen as prescribed.  Make sure there is food on your belly.   Do not operate heavy machinery with the flexeril.  You need to use a tennis ball against a wall on the muscle for added relief.    Muscle Cramps and Spasms Muscle cramps and spasms are when muscles tighten by themselves. They usually get better within minutes. Muscle cramps are painful. They are usually stronger and last longer than muscle spasms. Muscle spasms may or may not be painful. They can last a few seconds or much longer. Follow these instructions at home:  Drink enough fluid to keep your pee (urine) clear or pale yellow.  Massage, stretch, and relax the muscle.  If directed, apply heat to tight or tense muscles as often as told by your doctor. Use the heat source that your doctor recommends. ? Place a towel between your skin and the heat source. ? Leave the heat on for 20-30 minutes. ? Take off the heat if your skin turns bright red. This is especially important if you are unable to feel pain, heat, or cold. You may have a greater risk of getting burned.  If directed, put ice on the affected area. This may help if you are sore or have pain after a cramp or spasm. ? Put ice in a plastic bag. ? Place a towel between your skin and the bag. ? Leave the ice on for 20 minutes, 2-3 times a day.  Take over-the-counter and prescription medicines only as told by your doctor.  Pay attention to any changes in your symptoms. Contact a doctor if:  Your cramps or spasms get worse or happen more often.  Your cramps or spasms do not get better with time. This information is not intended to replace advice given to you by your health care provider. Make sure you discuss any questions you have with your health care provider. Document Released: 12/17/2007 Document Revised: 02/04/2015 Document Reviewed: 10/07/2014 Elsevier Interactive Patient Education  2018 [employer]     IF you received an x-ray today, you  will receive an invoice from [facility]. Please contact [facility] at [phone] with questions or concerns regarding your invoice.   IF you received labwork today, you will receive an invoice from [facility]. Please contact [facility] at [phone] with questions or concerns regarding your invoice.   Our billing staff will not be able to assist you with questions regarding bills from these companies.  You will be contacted with the lab results as soon as they are available. The fastest way to get your results is to activate your My Chart account. Instructions are located on the last page of this paperwork. If you have not heard from us regarding the results in 2 weeks, please contact this office.

## 2016-12-05 NOTE — Progress Notes (Signed)
[facility] [address], [place] [phone]  Date:  12/05/2016   Name:  Carolyn Savage   DOB:  07/10/1970   MRN:  [id number]  PCP:  Patient, No Pcp Per    History of Present Illness:  Carolyn Savage is a 46 y.o. female patient who presents to PCP with  Chief Complaint  Patient presents with  . Shoulder Pain    x 4 days/ pain going to back/ pt does heavy lifting at work     3 days ago, she woke up with pain in her shoulder.  She thought she may have slept on it wrong.  As the days progressed, to now, she has had more pain of her shoulder.  Right shoulder pain described as sharp along the back of her shoulder at her shoulder blade. She has never injured it before then.   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 No hx of trauma  There are no active problems to display for this patient.   No past medical history on file.  Past Surgical History:  Procedure Laterality Date  . LEEP      Social History   Tobacco Use  . Smoking status: Current Every Day Smoker    Packs/day: 1.00    Years: 21.00    Pack years: 21.00    Types: Cigarettes  . Smokeless tobacco: Never Used  Substance Use Topics  . Alcohol use: No    Comment: social very rare - a few times a year  . Drug use: No    Family History  Problem Relation Age of Onset  . Hyperlipidemia Mother   . Hypertension Mother   . Lung cancer Mother        smoker  . Diabetes Father   . Lung cancer Father        smoker  . Breast cancer Sister        age 45  . Diabetes Maternal Grandmother   . Breast cancer Maternal Grandmother   . Stroke Maternal Grandfather     No Known Allergies  Medication list has been reviewed and updated.  No current outpatient medications on file prior to visit.   No current facility-administered medications on file prior to visit.     ROS ROS otherwise  unremarkable unless listed above.  Physical Examination: BP 126/82   Pulse 82   Temp 98.7 F (37.1 C) (Oral)   Resp 16   Ht 5\' 5"  (1.651 m)   Wt 132 lb (59.9 kg)   LMP 11/18/2014 Comment: Hasn't had period in over a year   SpO2 96%   BMI 21.97 kg/m  Ideal Body Weight: Weight in (lb) to have BMI = 25: 149.9  Physical Exam  Constitutional: She is oriented to person, place, and time. She appears well-developed and well-nourished. No distress.  HENT:  Head: Normocephalic and atraumatic.  Right Ear: External ear normal.  Left Ear: External ear normal.  Eyes: Conjunctivae and EOM are normal. Pupils are equal, round, and reactive to light.  Cardiovascular: Normal rate.  Pulmonary/Chest: Effort normal. No respiratory distress.  Musculoskeletal:  Spasm appreciated at the right thoracic just inferior to the scapula.  exquisitely tender without erythema or warmth to the region.  Neurological: She is alert and oriented to person, place, and time.  Skin: She is not diaphoretic.  Psychiatric: She has a normal  mood and affect. Her behavior is normal.   Procedure performed with McVey, PA-C: Muscle spasm at the posterior thoracic was cleansed with [facility].  The tissue was lifted with gentle squeeze.  Muscle searched with 1% lidocaine placed at the musculature.  Wiped with et [place].  Bandage placed.  Assessment and Plan: Carolyn Savage is a 46 y.o. female who is here today for cc of  Chief Complaint  Patient presents with  . Shoulder Pain    x 4 days/ pain going to back/ pt does heavy lifting at work   muscle relaxer and nsaid given.   Advised rest and icing after heat at this time, and stretches.  Rtc in 1 week if no improvement.  Spasm of thoracic back muscle - Plan: cyclobenzaprine (FLEXERIL) 10 MG tablet, ibuprofen (ADVIL,MOTRIN) 600 MG tablet  Trena PlattStephanie Jachob Mcclean, PA-C [facility] 11/25/20186:34 PM

## 2017-03-07 ENCOUNTER — Ambulatory Visit (INDEPENDENT_AMBULATORY_CARE_PROVIDER_SITE_OTHER): Payer: Managed Care, Other (non HMO) | Admitting: Physician Assistant

## 2017-03-07 ENCOUNTER — Encounter: Payer: Self-pay | Admitting: Physician Assistant

## 2017-03-07 ENCOUNTER — Other Ambulatory Visit: Payer: Self-pay

## 2017-03-07 VITALS — BP 126/86 | HR 91 | Temp 98.4°F | Resp 17 | Ht 65.0 in | Wt 129.6 lb

## 2017-03-07 DIAGNOSIS — Z01419 Encounter for gynecological examination (general) (routine) without abnormal findings: Secondary | ICD-10-CM

## 2017-03-07 DIAGNOSIS — R87610 Atypical squamous cells of undetermined significance on cytologic smear of cervix (ASC-US): Secondary | ICD-10-CM | POA: Diagnosis not present

## 2017-03-07 DIAGNOSIS — N898 Other specified noninflammatory disorders of vagina: Secondary | ICD-10-CM

## 2017-03-07 DIAGNOSIS — Z0000 Encounter for general adult medical examination without abnormal findings: Secondary | ICD-10-CM | POA: Diagnosis not present

## 2017-03-07 DIAGNOSIS — Z1231 Encounter for screening mammogram for malignant neoplasm of breast: Secondary | ICD-10-CM | POA: Diagnosis not present

## 2017-03-07 DIAGNOSIS — R7989 Other specified abnormal findings of blood chemistry: Secondary | ICD-10-CM

## 2017-03-07 DIAGNOSIS — E7800 Pure hypercholesterolemia, unspecified: Secondary | ICD-10-CM | POA: Diagnosis not present

## 2017-03-07 DIAGNOSIS — Z130 Encounter for screening for diseases of the blood and blood-forming organs and certain disorders involving the immune mechanism: Secondary | ICD-10-CM

## 2017-03-07 DIAGNOSIS — Z23 Encounter for immunization: Secondary | ICD-10-CM

## 2017-03-07 DIAGNOSIS — Z13228 Encounter for screening for other metabolic disorders: Secondary | ICD-10-CM

## 2017-03-07 LAB — POCT WET + KOH PREP
Trich by wet prep: ABSENT
YEAST BY KOH: ABSENT
YEAST BY WET PREP: ABSENT

## 2017-03-07 NOTE — Progress Notes (Signed)
Carolyn Savage  MRN: [id number] DOB: 1970-03-12  PCP: Mancel Bale, PA-C  Subjective:  Pt presents to clinic for a CPE.  Last dental exam: never Last vision exam: long time - plans to make an appt - having trouble seeing Last pap: ASCUS last year with neg HPV Last mammo: will reschedule  Vaccinations      Tetanus - will get today   Typical meals for patient: 1-1.5 meals (lunch or dinner), no snacks Typical beverage choices: coffee (creamand sugar) and water Exercises: not regularly but she does exercise some outside of work Sleeps: 5-6 hrs per night and some nights good and some night wakes up and can not fall asleep   There are no active problems to display for this patient.   Review of Systems  Constitutional: Negative.   HENT: Negative.   Eyes: Negative.   Respiratory: Negative.   Cardiovascular: Negative.   Gastrointestinal: Negative.   Endocrine: Negative.   Genitourinary: Negative.   Musculoskeletal: Negative.   Skin: Negative.   Allergic/Immunologic: Negative.   Neurological: Negative.   Hematological: Negative.   Psychiatric/Behavioral: Negative.      No current outpatient medications on file prior to visit.   No current facility-administered medications on file prior to visit.     No Known Allergies  Social History   Socioeconomic History  . Marital status: Married    Spouse name: Richard  . Number of children: 5  . Years of education: None  . Highest education level: None  Social Needs  . Financial resource strain: None  . Food insecurity - worry: None  . Food insecurity - inability: None  . Transportation needs - medical: None  . Transportation needs - non-medical: None  Occupational History  . None  Tobacco Use  . Smoking status: Current Every Day Smoker    Packs/day: 1.00    Years: 23.00    Pack years: 23.00    Types: Cigarettes  . Smokeless tobacco: Never Used  Substance and Sexual Activity  . Alcohol use: No    Comment:  social very rare - a few times a year  . Drug use: No  . Sexual activity: Yes    Partners: Male  Other Topics Concern  . None  Social History Narrative   Marital status: married   Children: 5    Grandmother: 1   Lives with: husband and 2 children   Employment: Press photographer       Exercise:  Not regularly   Sleep: most part   Seatbelt: 100%   Guns in home: yes       Secured: yes       Past Surgical History:  Procedure Laterality Date  . LEEP      Family History  Problem Relation Age of Onset  . Hyperlipidemia Mother   . Hypertension Mother   . Lung cancer Mother        smoker  . Diabetes Father   . Lung cancer Father        smoker  . Breast cancer Sister        age 60  . Diabetes Maternal Grandmother   . Breast cancer Maternal Grandmother   . Stroke Maternal Grandfather      Objective:  BP 126/86 (BP Location: Left Arm, Patient Position: Sitting, Cuff Size: Normal)   Pulse 91   Temp 98.4 F (36.9 C) (Oral)   Resp 17   Ht _0  (1.651 m)   Wt 129 lb 9.6  oz (58.8 kg)   LMP 11/18/2014 Comment: Hasn't had period in over a year   SpO2 97%   BMI 21.57 kg/m   Physical Exam  Constitutional: She is oriented to person, place, and time and well-developed, well-nourished, and in no distress.  HENT:  Head: Normocephalic and atraumatic.  Right Ear: Hearing, tympanic membrane, external ear and ear canal normal.  Left Ear: Hearing, tympanic membrane, external ear and ear canal normal.  Nose: Nose normal.  Mouth/Throat: Uvula is midline, oropharynx is clear and moist and mucous membranes are normal.  Eyes: Conjunctivae and EOM are normal. Pupils are equal, round, and reactive to light.  Neck: Trachea normal and normal range of motion. Neck supple. No thyroid mass and no thyromegaly present.  Cardiovascular: Normal rate, regular rhythm and normal heart sounds.  No murmur heard. Pulmonary/Chest: Effort normal and breath sounds normal. She has no wheezes. Right breast exhibits  no inverted nipple, no mass, no nipple discharge, no skin change and no tenderness. Left breast exhibits no inverted nipple, no mass, no nipple discharge, no skin change and no tenderness. Breasts are symmetrical.  Abdominal: Soft. Bowel sounds are normal. There is no tenderness.  Genitourinary: Right adnexa normal and left adnexa normal. Thick  odorless  white and vaginal discharge found.  Musculoskeletal: Normal range of motion.  Lymphadenopathy:    She has no cervical adenopathy.  Neurological: She is alert and oriented to person, place, and time. She has normal motor skills, normal sensation, normal strength and normal reflexes. Gait normal.  Skin: Skin is warm and dry.  Psychiatric: Mood, memory, affect and judgment normal.    Wt Readings from Last 3 Encounters:  03/07/17 129 lb 9.6 oz (58.8 kg)  12/05/16 132 lb (59.9 kg)  06/20/16 133 lb (60.3 kg)     Visual Acuity Screening   Right eye Left eye Both eyes  Without correction: _0  With correction:       Assessment and Plan :  Annual physical exam  Encounter for gynecological examination without abnormal finding  Atypical squamous cell changes of undetermined significance (ASCUS) on cervical cytology with negative high risk human papilloma virus (HPV) test result - Plan: Pap IG and HPV (high risk) DNA detection - recheck today  Encounter for screening mammogram for breast cancer - Plan: MM Digital Screening  Need for diphtheria-tetanus-pertussis (Tdap) vaccine - Plan: Tdap vaccine greater than or equal to 7yo IM - we did not have in stock - future order placed for pt to RTC to have it done at her convinence.  Screening for deficiency anemia - Plan: CBC with Differential/Platelet  Screening for metabolic disorder - Plan: CMP14+EGFR  Elevated LDL cholesterol level - Plan: Lipid panel  Elevated TSH - Plan: Thyroid Panel With TSH  Vaginal discharge - Plan: POCT Wet + KOH Prep -   Windell Hummingbird PA-C  Primary  Care at [facility] 03/07/2017 2:33 PM

## 2017-03-07 NOTE — Patient Instructions (Addendum)
Melatonin to help you sleep better.    IF you received an x-ray today, you will receive an invoice from [facility]. Please contact [facility] at [phone] with questions or concerns regarding your invoice.   IF you received labwork today, you will receive an invoice from [facility]. Please contact [facility] at [phone] with questions or concerns regarding your invoice.   Our billing staff will not be able to assist you with questions regarding bills from these companies.  You will be contacted with the lab results as soon as they are available. The fastest way to get your results is to activate your My Chart account. Instructions are located on the last page of this paperwork. If you have not heard from us regarding the results in 2 weeks, please contact this office.    We recommend that you schedule a mammogram for breast cancer screening. Typically, you do not need a referral to do this. Please contact a local imaging center to schedule your mammogram.  [facility]) - [phone] or [phone]   Typically, you do not need a referral to do this.

## 2017-03-08 LAB — CBC WITH DIFFERENTIAL/PLATELET
BASOS ABS: 0.1 10*3/uL (ref 0.0–0.2)
Basos: 1 %
EOS (ABSOLUTE): 0.2 10*3/uL (ref 0.0–0.4)
EOS: 2 %
HEMATOCRIT: 41.9 % (ref 34.0–46.6)
HEMOGLOBIN: 14.3 g/dL (ref 11.1–15.9)
IMMATURE GRANS (ABS): 0 10*3/uL (ref 0.0–0.1)
Immature Granulocytes: 0 %
Lymphocytes Absolute: 3.2 10*3/uL — ABNORMAL HIGH (ref 0.7–3.1)
Lymphs: 36 %
MCH: 30.3 pg (ref 26.6–33.0)
MCHC: 34.1 g/dL (ref 31.5–35.7)
MCV: 89 fL (ref 79–97)
MONOCYTES: 6 %
Monocytes Absolute: 0.6 10*3/uL (ref 0.1–0.9)
NEUTROS PCT: 55 %
Neutrophils Absolute: 4.9 10*3/uL (ref 1.4–7.0)
Platelets: 259 10*3/uL (ref 150–379)
RBC: 4.72 x10E6/uL (ref 3.77–5.28)
RDW: 13.6 % (ref 12.3–15.4)
WBC: 8.9 10*3/uL (ref 3.4–10.8)

## 2017-03-08 LAB — CMP14+EGFR
ALBUMIN: 4.8 g/dL (ref 3.5–5.5)
ALK PHOS: 91 IU/L (ref 39–117)
ALT: 12 IU/L (ref 0–32)
AST: 14 IU/L (ref 0–40)
Albumin/Globulin Ratio: 2 (ref 1.2–2.2)
BUN / CREAT RATIO: 13 (ref 9–23)
BUN: 11 mg/dL (ref 6–24)
Bilirubin Total: 0.3 mg/dL (ref 0.0–1.2)
CALCIUM: 10 mg/dL (ref 8.7–10.2)
CHLORIDE: 101 mmol/L (ref 96–106)
CO2: 25 mmol/L (ref 20–29)
Creatinine, Ser: 0.87 mg/dL (ref 0.57–1.00)
GFR calc Af Amer: 93 mL/min/{1.73_m2} (ref >59)
GFR, EST NON AFRICAN AMERICAN: 81 mL/min/{1.73_m2} (ref >59)
GLUCOSE: 80 mg/dL (ref 65–99)
Globulin, Total: 2.4 g/dL (ref 1.5–4.5)
POTASSIUM: 4.8 mmol/L (ref 3.5–5.2)
Sodium: 143 mmol/L (ref 134–144)
Total Protein: 7.2 g/dL (ref 6.0–8.5)

## 2017-03-08 LAB — LIPID PANEL
CHOL/HDL RATIO: 5.8 ratio — AB (ref 0.0–4.4)
Cholesterol, Total: 306 mg/dL — ABNORMAL HIGH (ref 100–199)
HDL: 53 mg/dL (ref >39)
LDL Calculated: 207 mg/dL — ABNORMAL HIGH (ref 0–99)
Triglycerides: 228 mg/dL — ABNORMAL HIGH (ref 0–149)
VLDL Cholesterol Cal: 46 mg/dL — ABNORMAL HIGH (ref 5–40)

## 2017-03-08 LAB — THYROID PANEL WITH TSH
FREE THYROXINE INDEX: 2.2 (ref 1.2–4.9)
T3 UPTAKE RATIO: 27 % (ref 24–39)
T4 TOTAL: 8.3 ug/dL (ref 4.5–12.0)
TSH: 0.771 u[IU]/mL (ref 0.450–4.500)

## 2017-03-09 LAB — PAP IG AND HPV HIGH-RISK
HPV, HIGH-RISK: NEGATIVE
PAP Smear Comment: 0

## 2017-03-13 ENCOUNTER — Encounter: Payer: Self-pay | Admitting: Physician Assistant

## 2017-03-13 DIAGNOSIS — E7800 Pure hypercholesterolemia, unspecified: Secondary | ICD-10-CM | POA: Insufficient documentation

## 2017-04-26 ENCOUNTER — Encounter: Payer: Self-pay | Admitting: Physician Assistant

## 2018-05-17 IMAGING — DX DG LUMBAR SPINE 2-3V
3 series · 3 of 3 positions shown · non-contrast
Comparison: None.

CLINICAL DATA: Low back pain

EXAM:
LUMBAR SPINE - 2-3 VIEW

[l-spine ap]
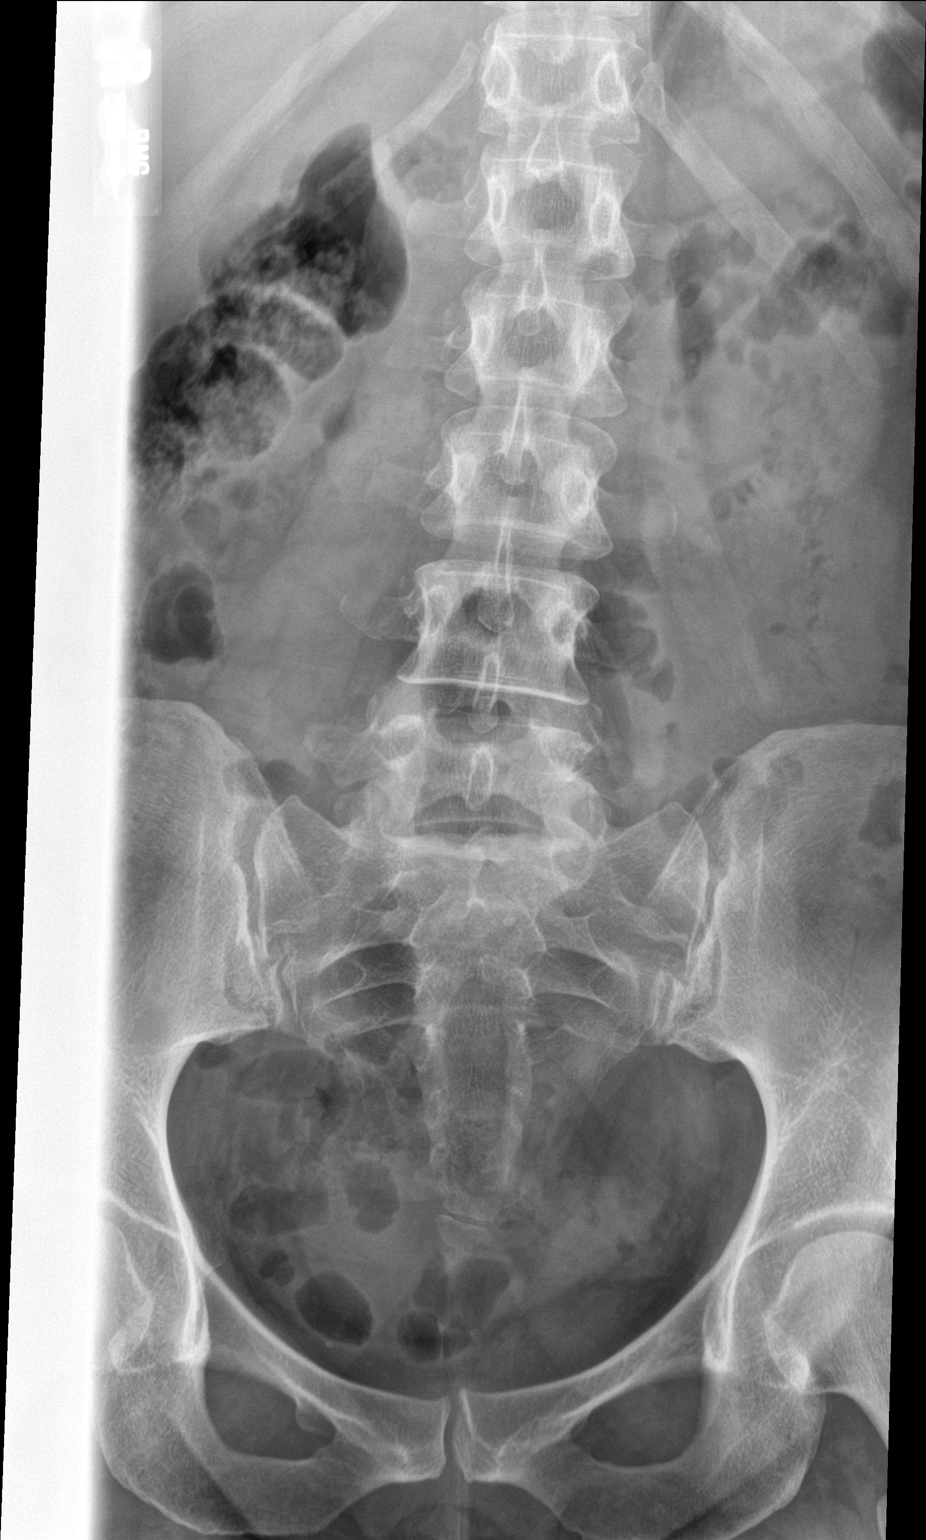

[l-spine lat]
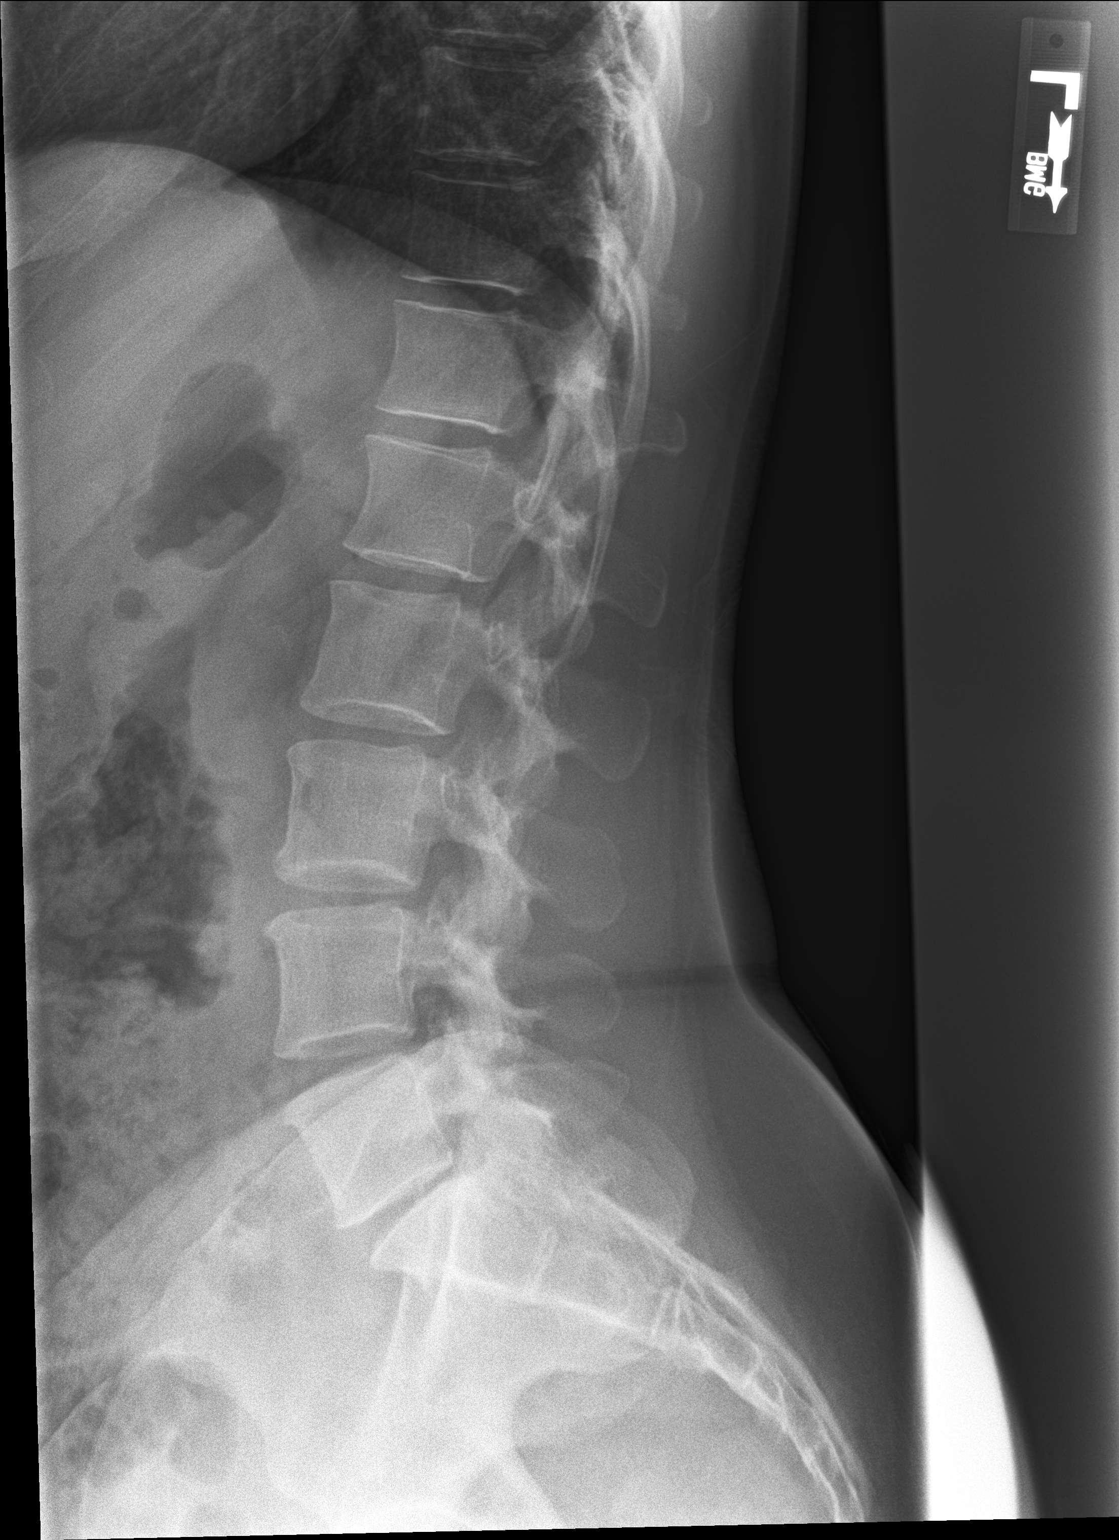

[l-spine l5-s1]
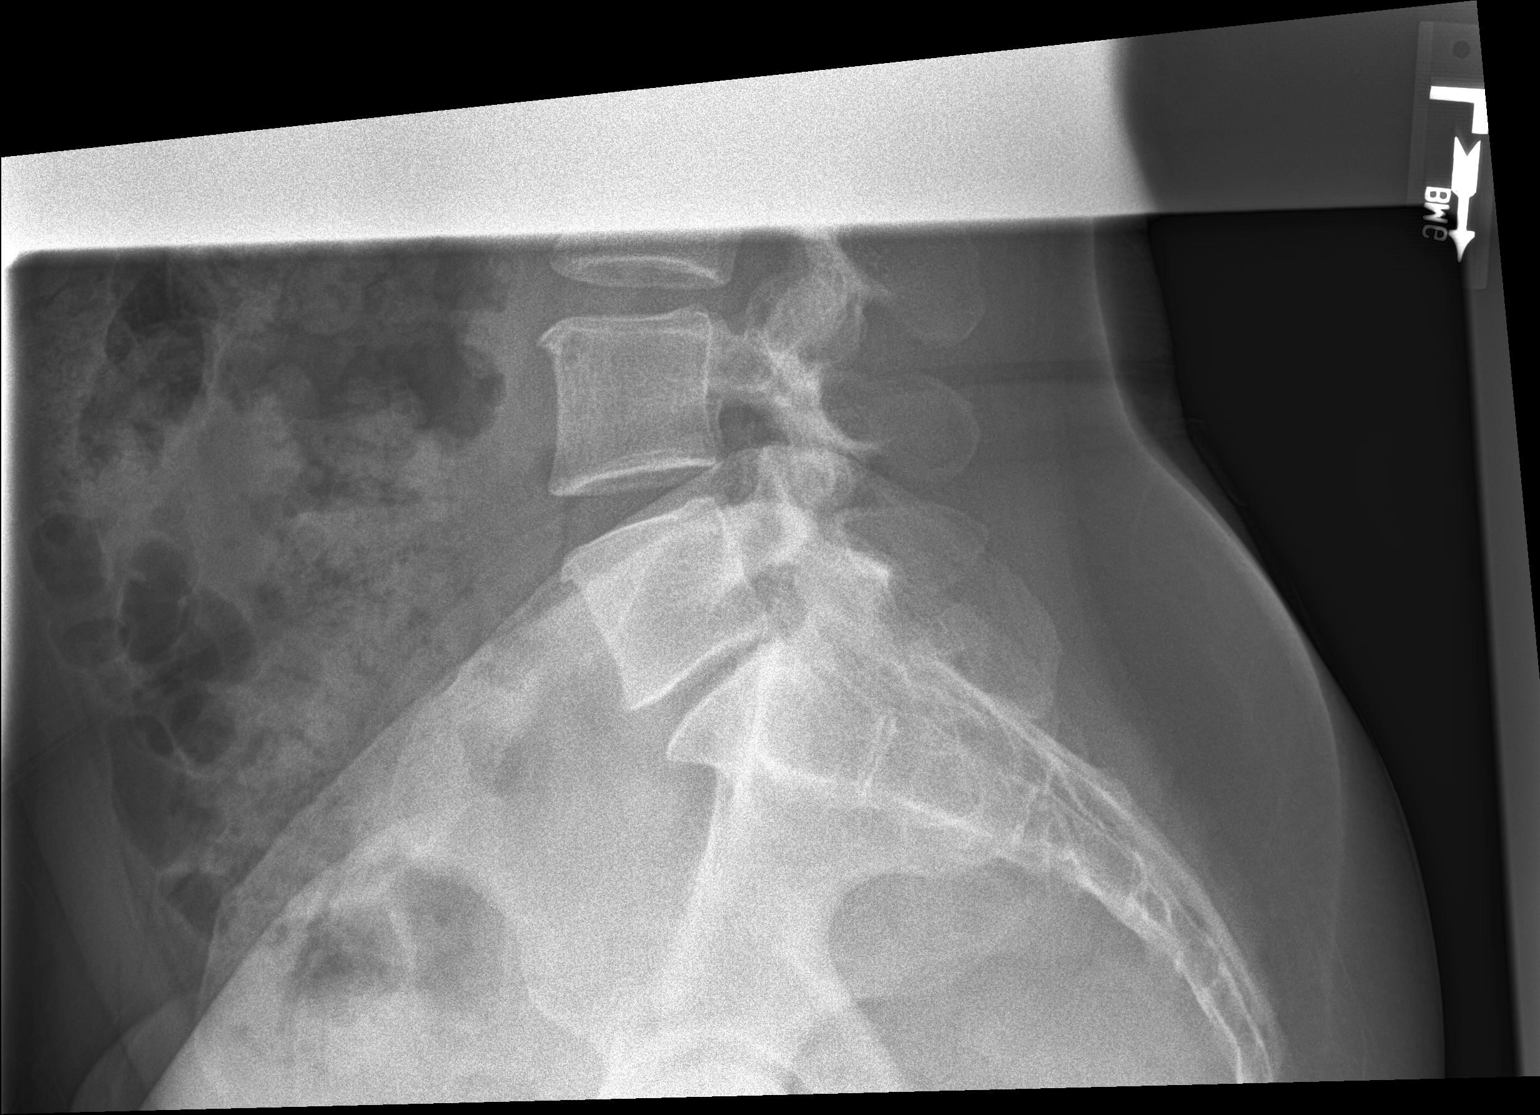

[3 of 3 positions shown; findings below may reference images not displayed]

FINDINGS: There is anatomic alignment. There is no vertebral compression
deformity. There is moderate narrowing of the L5-S1 disc. Minimal
facet arthropathy at L4-5 and L5-S1.
IMPRESSION: No acute bony pathology.  Chronic changes.

## 2020-01-23 ENCOUNTER — Emergency Department (HOSPITAL_COMMUNITY)
Admission: EM | Admit: 2020-01-23 | Discharge: 2020-01-23 | Disposition: A | Discharge status: Home / Self Care | Payer: Managed Care, Other (non HMO) | Attending: Emergency Medicine | Admitting: Emergency Medicine

## 2020-01-23 ENCOUNTER — Encounter (HOSPITAL_COMMUNITY): Payer: Self-pay | Admitting: Emergency Medicine

## 2020-01-23 DIAGNOSIS — R519 Headache, unspecified: Secondary | ICD-10-CM | POA: Diagnosis present

## 2020-01-23 DIAGNOSIS — Z5321 Procedure and treatment not carried out due to patient leaving prior to being seen by health care provider: Secondary | ICD-10-CM | POA: Insufficient documentation

## 2020-01-23 DIAGNOSIS — U071 COVID-19: Secondary | ICD-10-CM | POA: Insufficient documentation

## 2020-01-23 LAB — RESP PANEL BY RT-PCR (FLU A&B, COVID) ARPGX2
Influenza A by PCR: NEGATIVE
Influenza B by PCR: NEGATIVE
SARS Coronavirus 2 by RT PCR: POSITIVE — AB

## 2020-01-23 MED ORDER — ACETAMINOPHEN 325 MG PO TABS
650.0000 mg | ORAL_TABLET | Freq: Once | ORAL | Status: AC | PRN
  Administered 2020-01-23: 650 mg via ORAL
  Filled 2020-01-23: qty 2

## 2020-01-23 NOTE — ED Triage Notes (Signed)
Pt reports headache, fever 100.5, body aches that began this morning while at work.
